# Patient Record
Sex: Female | Born: 1955 | Race: White | Hispanic: No | Marital: Married
Health system: Southern US, Community
[De-identification: ages and names within clinical notes are randomized; demographics above are authoritative.]

## PROBLEM LIST (undated history)

## (undated) DIAGNOSIS — M1990 Unspecified osteoarthritis, unspecified site: Secondary | ICD-10-CM

## (undated) DIAGNOSIS — M797 Fibromyalgia: Secondary | ICD-10-CM

## (undated) DIAGNOSIS — C801 Malignant (primary) neoplasm, unspecified: Secondary | ICD-10-CM

## (undated) DIAGNOSIS — G629 Polyneuropathy, unspecified: Secondary | ICD-10-CM

## (undated) HISTORY — DX: Malignant (primary) neoplasm, unspecified: C80.1

## (undated) HISTORY — DX: Fibromyalgia: M79.7

## (undated) HISTORY — PX: MANDIBLE SURGERY: SHX707

## (undated) HISTORY — PX: TONSILLECTOMY: SUR1361

## (undated) HISTORY — DX: Polyneuropathy, unspecified: G62.9

## (undated) HISTORY — DX: Unspecified osteoarthritis, unspecified site: M19.90

---

## 2005-09-23 HISTORY — PX: CHOLECYSTECTOMY: SHX55

## 2009-09-23 DIAGNOSIS — M797 Fibromyalgia: Secondary | ICD-10-CM

## 2009-09-23 HISTORY — DX: Fibromyalgia: M79.7

## 2010-04-09 ENCOUNTER — Ambulatory Visit: Payer: Self-pay | Admitting: Internal Medicine

## 2010-08-30 ENCOUNTER — Emergency Department (HOSPITAL_COMMUNITY): Admission: EM | Admit: 2010-08-30 | Discharge: 2009-12-20 | Payer: Self-pay | Admitting: Emergency Medicine

## 2011-06-23 IMAGING — CR DG ANKLE COMPLETE 3+V*L*
1 series · 4 of 4 positions shown · non-contrast
Comparison: none

REASON FOR EXAM: ARTHRITIS PAIN IN BILATERAL HANDS, KNEES, ANKLES AND FEET
COMMENTS:

[Series 1: view not recorded · 0.17mm/px · 4 of 4 slices shown]
[im 1/4]
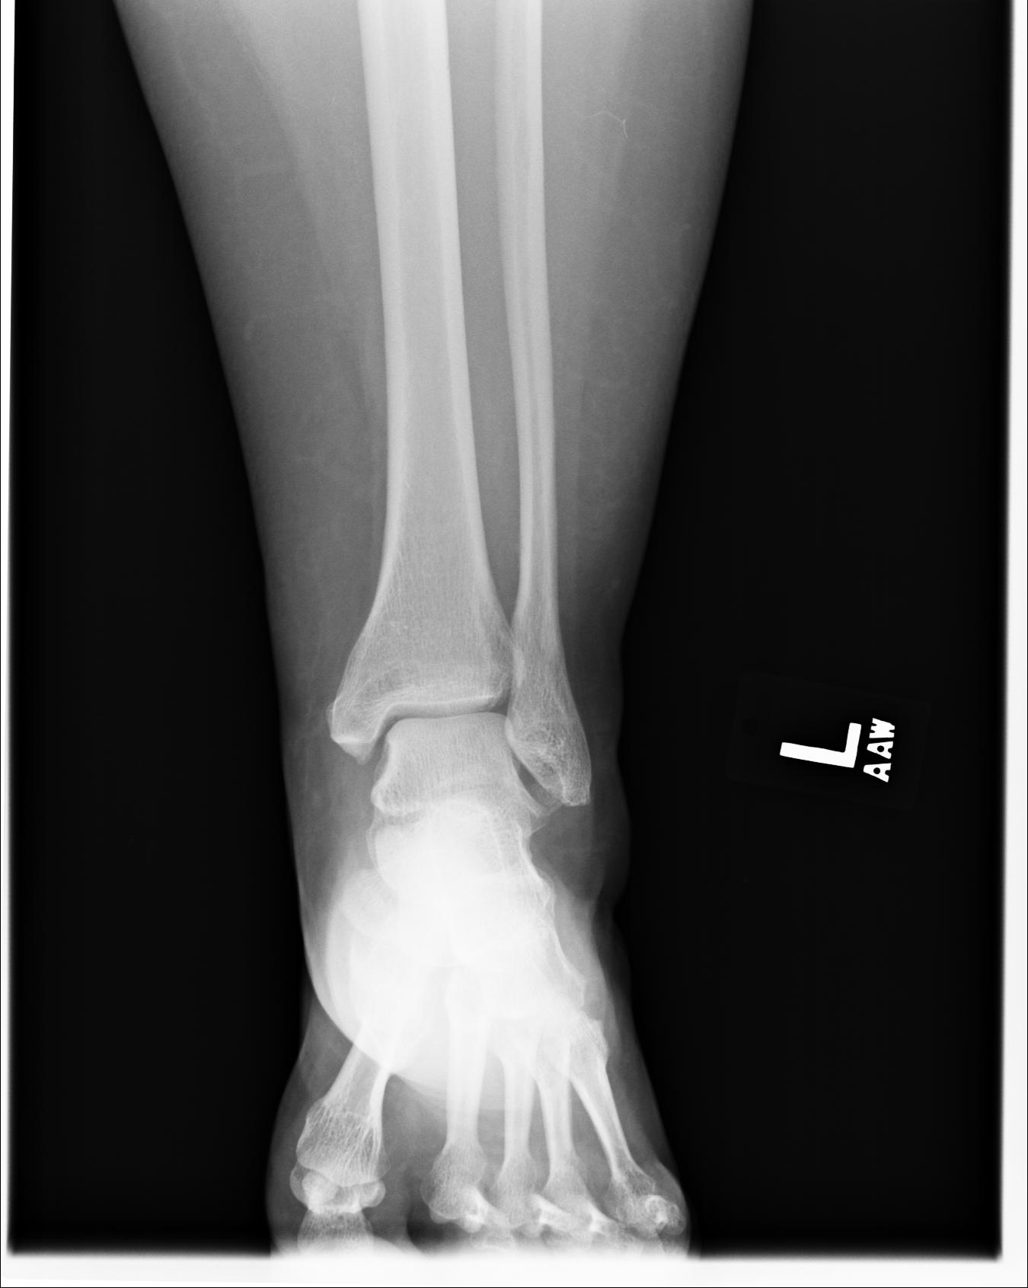
[im 2/4]
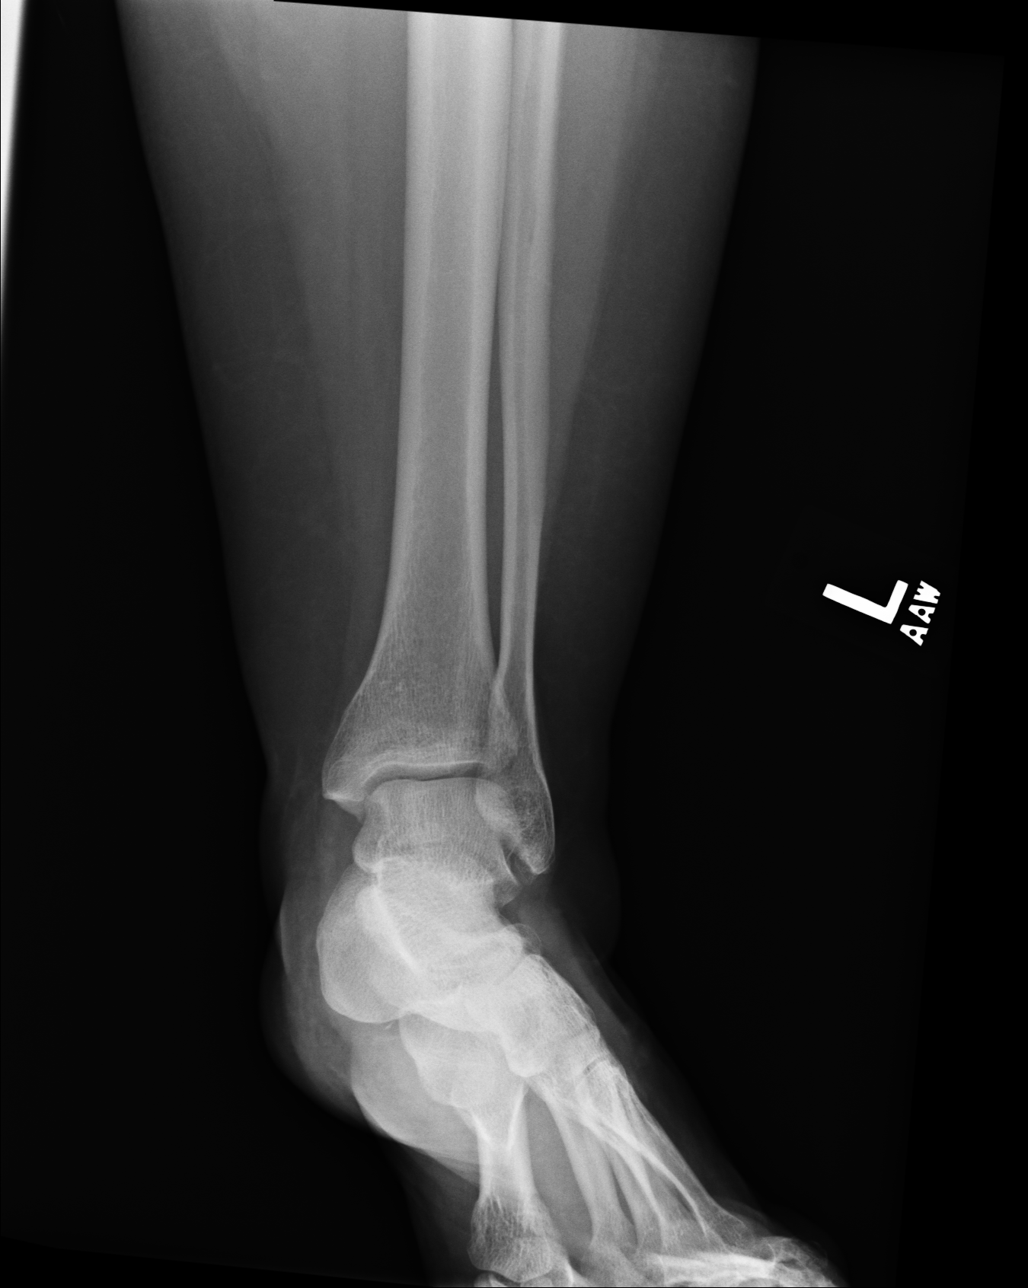
[im 3/4]
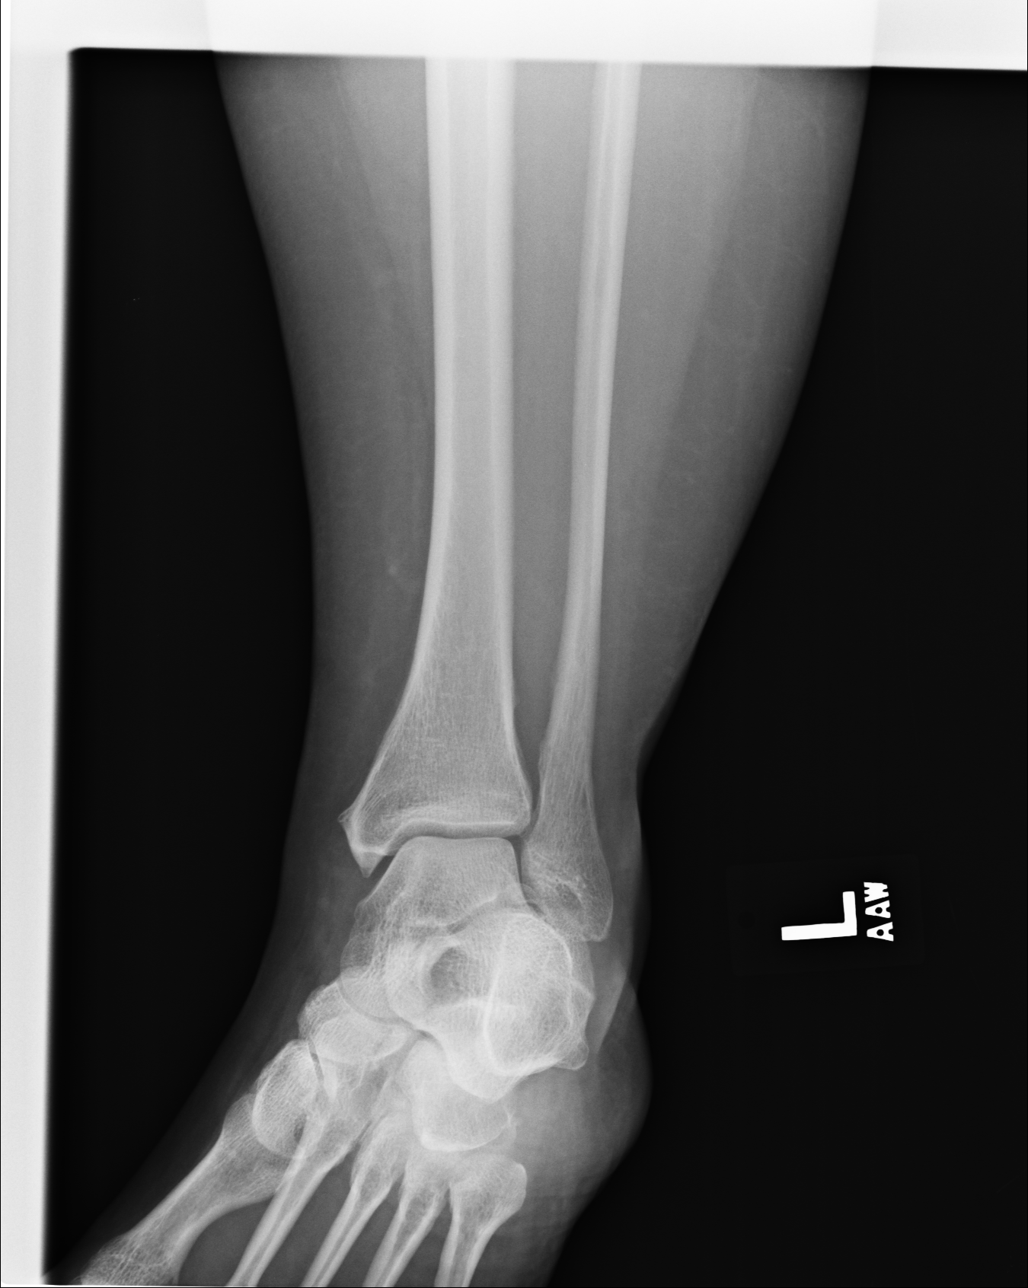
[im 4/4]
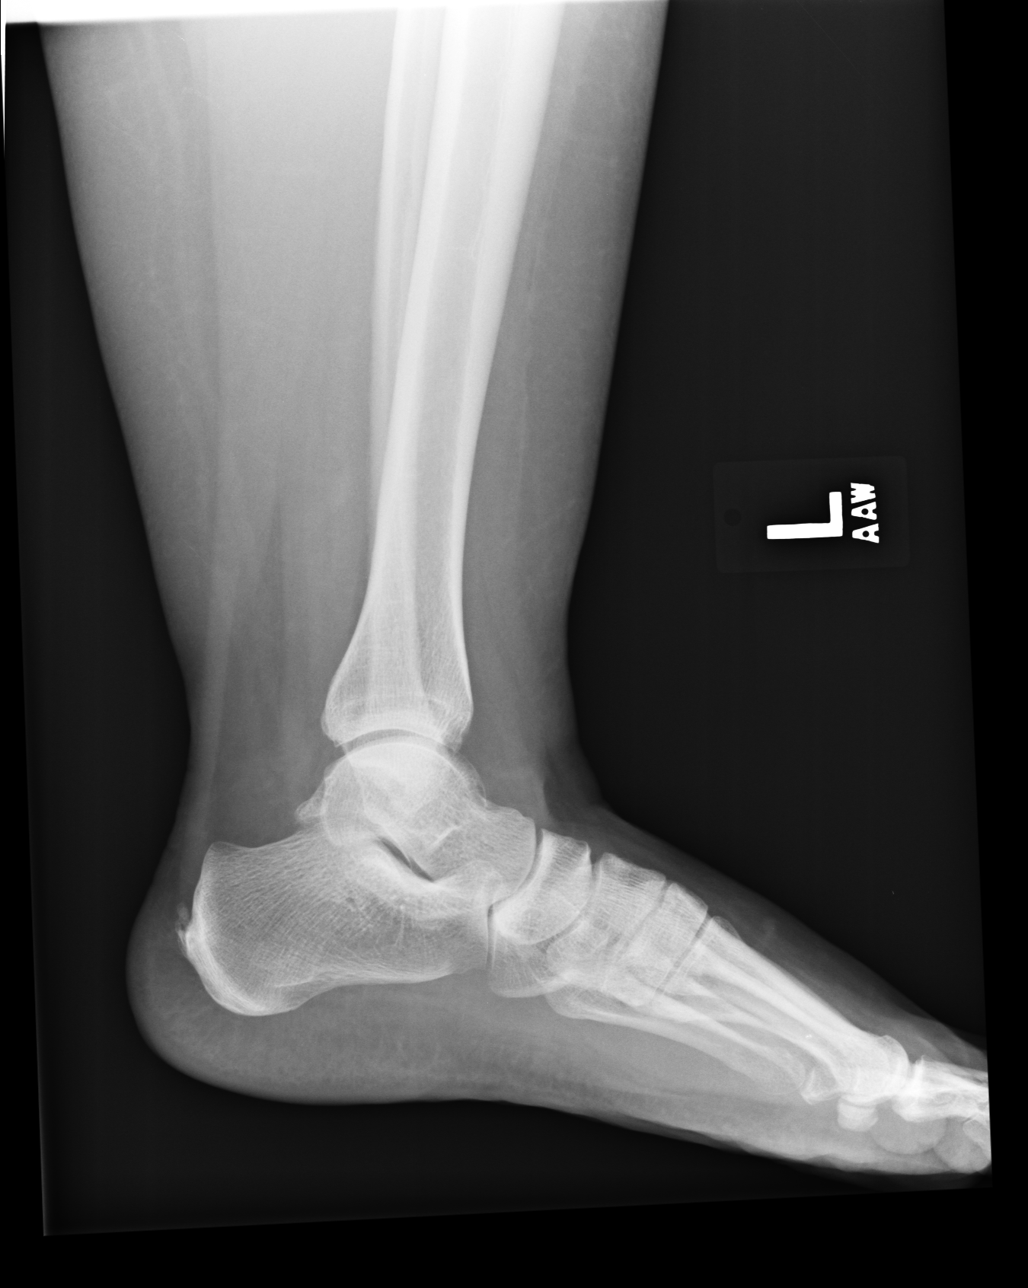

[4 of 4 positions shown; findings below may reference images not displayed]

PROCEDURE:     DXR - DXR ANKLE LEFT COMPLETE  - April 09, 2010  [DATE]

RESULT:     Four views of the left ankle are submitted. The ankle joint
mortise is preserved. The talar dome appears intact. I see no osteochondral
lesions. I do not see evidence of an acute malleolar fracture or other acute
abnormality of the malleolar height. There is an Achilles region calcaneal
spur.
IMPRESSION: I see no significant arthritic change of the left ankle.

## 2013-09-23 HISTORY — PX: KNEE ARTHROSCOPY WITH LATERAL MENISECTOMY: SHX6193

## 2013-12-16 ENCOUNTER — Ambulatory Visit: Payer: Self-pay | Admitting: Family Medicine

## 2014-03-07 ENCOUNTER — Ambulatory Visit: Payer: Self-pay | Admitting: Family Medicine

## 2014-09-02 ENCOUNTER — Ambulatory Visit: Payer: Self-pay | Admitting: Orthopedic Surgery

## 2014-09-29 ENCOUNTER — Ambulatory Visit: Payer: Self-pay | Admitting: Anesthesiology

## 2014-09-29 LAB — CBC WITH DIFFERENTIAL/PLATELET
BASOS ABS: 0 10*3/uL (ref 0.0–0.1)
Basophil %: 0.6 %
EOS PCT: 1.7 %
Eosinophil #: 0.1 10*3/uL (ref 0.0–0.7)
HCT: 40.4 % (ref 35.0–47.0)
HGB: 13.1 g/dL (ref 12.0–16.0)
Lymphocyte #: 1.6 10*3/uL (ref 1.0–3.6)
Lymphocyte %: 24 %
MCH: 29.9 pg (ref 26.0–34.0)
MCHC: 32.4 g/dL (ref 32.0–36.0)
MCV: 92 fL (ref 80–100)
Monocyte #: 0.6 x10 3/mm (ref 0.2–0.9)
Monocyte %: 8.3 %
NEUTROS PCT: 65.4 %
Neutrophil #: 4.4 10*3/uL (ref 1.4–6.5)
Platelet: 232 10*3/uL (ref 150–440)
RBC: 4.37 10*6/uL (ref 3.80–5.20)
RDW: 14.2 % (ref 11.5–14.5)
WBC: 6.7 10*3/uL (ref 3.6–11.0)

## 2014-09-29 LAB — BASIC METABOLIC PANEL
Anion Gap: 6 — ABNORMAL LOW (ref 7–16)
BUN: 11 mg/dL (ref 7–18)
CALCIUM: 9.3 mg/dL (ref 8.5–10.1)
Chloride: 106 mmol/L (ref 98–107)
Co2: 31 mmol/L (ref 21–32)
Creatinine: 1.02 mg/dL (ref 0.60–1.30)
EGFR (African American): >60
EGFR (Non-African Amer.): 59 — ABNORMAL LOW
Glucose: 88 mg/dL (ref 65–99)
Osmolality: 284 (ref 275–301)
POTASSIUM: 3.8 mmol/L (ref 3.5–5.1)
Sodium: 143 mmol/L (ref 136–145)

## 2014-10-04 ENCOUNTER — Ambulatory Visit: Payer: Self-pay | Admitting: Orthopedic Surgery

## 2014-12-30 ENCOUNTER — Ambulatory Visit
Admit: 2014-12-30 | Disposition: A | Discharge status: Home / Self Care | Payer: Self-pay | Attending: Family Medicine | Admitting: Family Medicine

## 2015-01-22 NOTE — Op Note (Signed)
PATIENT NAME:  Frances Krueger, Frances Krueger MR#:  [id number] DATE OF BIRTH:  Sep 22, 1956  DATE OF PROCEDURE:  10/04/2014  PREOPERATIVE DIAGNOSIS: Medial meniscus tear, left knee osteoarthritis.   POSTOPERATIVE DIAGNOSIS: Medial meniscus tear, left knee osteoarthritis with plica band.   PROCEDURE: Arthroscopy, left knee partial medial meniscectomy, excision of plica band.   ANESTHESIA: General.   SURGEON: Hessie Knows, MD   DESCRIPTION OF PROCEDURE: The patient was brought to the operating room and after adequate anesthesia was obtained, the left leg was prepped and draped in the usual sterile fashion with the leg in the arthroscopic leg holder with a tourniquet applied. After patient identification and timeout procedures were completed, an inferolateral portal was made and the arthroscope was introduced. Initial inspection revealed a very large plica band extending across the anterior aspect of the knee medially, appearing to impinge on both the patellofemoral joint and the medial condyle with flexion. There was moderate degenerative change to the patella with some fissuring, but no full-thickness cartilage loss. The patella appeared to track normally. Next, going around inferior medially, an inferomedial portal was made, and, on probing, there was a radial tear as previously shown on MRI at the junction of the middle and posterior thirds and fissuring of the cartilage adjacent to this area. The articular cartilage had partial thickness loss throughout the medial compartment. The ACL was intact and the lateral compartment was relatively normal with just a few small areas of grade 1 and 2 changes, otherwise relatively normal. The meniscus was intact to probing. The gutters were free of any loose bodies. A meniscal punch was used first, followed by a shaver and then an ArthroCare wand to resect the meniscus tear back to a stable margin with pre- and postprocedure pictures obtained. The tourniquet was raised when the  shaver was used for the plica to minimize bleeding and improve visualization. The plica was debrided with the shaver and ArthroCare wand used for cauterization of bleeding of small bleeding vessels. After complete resection of this very large band, again with pre- and postprocedure pictures obtained, the knee was thoroughly irrigated until clear. All instrumentation was withdrawn. The wounds were closed with simple interrupted 4-0 nylon and 20 mL of 0.5% Sensorcaine with epinephrine was infiltrated in the area of the portals for postoperative analgesia. Xeroform, 4 x 4's, Webril and Ace wrap applied. The patient was sent to the recovery room in stable condition.   ESTIMATED BLOOD LOSS: Minimal.   COMPLICATIONS: None.   SPECIMEN: None.   TOURNIQUET TIME: 13 minutes at 300 mmHg.    ____________________________ Laurene Footman, MD mjm:bm D: 10/04/2014 20:37:56 ET T: 10/05/2014 03:27:45 ET JOB#: [id number]  cc: Laurene Footman, MD, <Dictator> Laurene Footman MD ELECTRONICALLY SIGNED 10/05/2014 8:40

## 2015-09-24 DIAGNOSIS — G629 Polyneuropathy, unspecified: Secondary | ICD-10-CM

## 2015-09-24 HISTORY — DX: Polyneuropathy, unspecified: G62.9

## 2016-05-01 ENCOUNTER — Encounter: Payer: Self-pay | Admitting: Physical Therapy

## 2016-05-01 ENCOUNTER — Ambulatory Visit: Payer: BLUE CROSS/BLUE SHIELD | Attending: Internal Medicine | Admitting: Physical Therapy

## 2016-05-01 DIAGNOSIS — M791 Myalgia, unspecified site: Secondary | ICD-10-CM

## 2016-05-01 DIAGNOSIS — R2689 Other abnormalities of gait and mobility: Secondary | ICD-10-CM | POA: Diagnosis present

## 2016-05-02 NOTE — Therapy (Signed)
[place] MAIN [facility] SERVICES [address] [place], [place], [address] Phone: [phone]   Fax:  [phone]  Physical Therapy Evaluation  Patient Details  Name: Frances Krueger MRN: [id number] Date of Birth: 09-02-1956 Referring Provider: Meda Coffee  Encounter Date: 05/01/2016      PT End of Session - 05/02/16 2252    Visit Number 1   Number of Visits 12   Date for PT Re-Evaluation 07/09/16   PT Start Time 1500   PT Stop Time [id number]   PT Time Calculation (min) 75 min   Activity Tolerance Patient tolerated treatment well;No increased pain   Behavior During Therapy WFL for tasks assessed/performed      Past Medical History:  Diagnosis Date  . Arthritis    L knee  . Cancer ([place])    skin cancer   . Fibromyalgia 2011  . Fibromyalgia 2011  . Neuropathy ([place]) 2017   both feet     Past Surgical History:  Procedure Laterality Date  . CHOLECYSTECTOMY  2007  . KNEE ARTHROSCOPY WITH LATERAL MENISECTOMY  2015  . MANDIBLE SURGERY     at 60 years of age, surgery was done 2/2 under bite  . TONSILLECTOMY      There were no vitals filed for this visit.       Subjective Assessment - 05/02/16 2245    Subjective Pt reported BLE in legs and feet, shoulders/ necks, fingers. 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 Pain is worst in morning and night. 3-4 hrs of sleep 2/2 pain. Pt has tried aquatic therapy and she regained ROM but she felt it didnot help with the pain.  Today, pt is 5-6/10 in her knees, feet, ankles, fingers, shoulders. Husband and grandson present in the room during interview but left the room during assessment and Tx.      Pertinent History Dx of fibromyalgia, neuropathy    Patient Stated Goals work more on her job, do more housework, sleep more            [facility] PT Assessment - 05/02/16 2246      Assessment   Medical Diagnosis fibromyalgia   Referring Provider Meda Coffee     Precautions   Precautions None     Restrictions   Weight Bearing Restrictions No     Balance Screen   Has the patient fallen in the past 6 months No     Observation/Other Assessments   Observations hyperextended knees    Other Surveys  --  PDI 78%, ZUNG anxiety 55%, PSQI 67%      Sensation   Light Touch --  less sensation on L LE at L4, L5 > R      Coordination   Gross Motor Movements are Fluid and Coordinated --  chest breathing, diaphragm breathing achieved with minor cue     Posture/Postural Control   Posture Comments lumbopelvic perturbations with leg movements in supine     AROM   Overall AROM Comments L/R sidebend with LBP p!, L rotation ~20% , R rotation 40%      Strength   Overall Strength Comments 4/5 BLE gross assessment     Palpation   Palpation comment increased mm tensions and tenderness (finching) at upper traps to T 3 level of spine      Ambulation/Gait  Gait velocity 0.67 m/s                  Pelvic Floor Special Questions - 05/01/16 1557    Diastasis Recti 3 fingers below sternum, above umbilicus           [facility] Adult PT Treatment/Exercise - 05/02/16 2246      Therapeutic Activites    Therapeutic Activities --  educated on nn system, incorporating self care practices     Neuro Re-ed    Neuro Re-ed Details  guided diaphragmatic breathing, standing mechanics, semi tandems tance to offload on legs and 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, proper lifting mechanics, sweeping mechanics      Manual Therapy   Manual therapy comments light manual therapy at cranuim, ribcage, sacrum, thoracic segments in supine                PT Education - 05/02/16 2251    Education provided Yes   Education Details POC, anatomy/ physiology, pain science education, Customer service manager) Educated Patient   Methods Explanation;Demonstration;Tactile cues;Verbal  cues;Handout   Comprehension Returned demonstration;Verbalized understanding             PT Long Term Goals - 05/02/16 2302      PT LONG TERM GOAL #1   Title Pt will decrease her [place] score from  78% to < 50%  in order to participate in more family and community activities.      PT LONG TERM GOAL #2   Title Pt will decrease her score on PSQI 67% to < 50% in order to improve sleep quality and decrease inflammatory processes.    Time 12   Period Weeks   Status New     PT LONG TERM GOAL #3   Title Pt will decrease her ZUNG anxiety score from 55% to < 45% in order to demo IND with relaxation techniques for improved stress management, retrain pain, and to decrease mm tensions.    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  These pains are associated with her Dx of fibromyalgia, neuropathy, and arthritis. Pt 's clinical presentations include diastasis recti and poor deep core strength, decreased spinal mobility, increased mm tensions along upper back and back, poor posture, decreased balance, poor body  mechanics with loaded activities,decreased LE sensation along L4/5 dermatomes, and slow gait speed. Pt responded well to light manual technique after which she reported feeling 80% better with a more "relaxed" feeling. Pt also demo'd proper body mechanics with household activities, work stretches, and alternative standing postures to decrease mm strain/ tensions when standing at work. Pain returned with upright position and pt was  educated on POC which will help to strengthen deep core mm for postural stability and decreased overuse of superficial mm. Pt was also educated on pain science  which pt voiced understanding.   Patient will benefit from skilled therapeutic intervention in order to improve the following deficits and impairments:  Pain, Improper body mechanics, Impaired sensation, Abnormal gait, Impaired tone, Postural dysfunction, Other (comment), Increased muscle spasms, Hypermobility, Decreased scar mobility, Decreased mobility, Decreased coordination, Decreased activity tolerance, Decreased endurance, Hypomobility, Impaired perceived functional ability, Decreased balance, Decreased knowledge of precautions, Decreased safety awareness, Difficulty walking, Decreased strength, Decreased range of motion, Impaired flexibility         Rehab Potential Good   Clinical Impairments Affecting Rehab Potential chronicity of Sx, co-morbidities,    PT Frequency 1x / week   PT Duration 12 weeks   PT Treatment/Interventions Aquatic Therapy;ADLs/Self Care Home Management;Electrical Stimulation;Cryotherapy;Moist Heat;Traction;Balance training;Therapeutic exercise;Therapeutic activities;Functional mobility training;Stair training;Gait training;Neuromuscular re-education;Patient/family education;Prosthetic Training;Scar mobilization;Manual lymph drainage;Manual techniques;Energy conservation, pain science education   Consulted and Agree with Plan of Care Patient  husband and son present for interview. absent for assessment and  Tx      Patient will benefit from skilled therapeutic intervention in order to improve the following deficits and impairments:  Pain, Improper body mechanics, Impaired sensation, Abnormal gait, Impaired tone, Postural dysfunction, Other (comment), Increased muscle spasms, Hypermobility, Decreased scar mobility, Decreased mobility, Decreased coordination, Decreased activity tolerance, Decreased endurance,  Hypomobility, Impaired perceived functional ability, Decreased balance, Decreased knowledge of precautions, Decreased safety awareness, Difficulty walking, Decreased strength, Decreased range of motion, Impaired flexibility  Visit Diagnosis: Myalgia  Other abnormalities of gait and mobility     Problem List There are no active problems to display for this patient.   Jerl Mina ,PT, DPT, E-RYT 05/02/2016, 11:12 PM  [place] MAIN [facility] SERVICES [address] [place], [place], [address] Phone: [phone]   Fax:  [phone]  Name: Frances Krueger MRN: [id number] Date of Birth: 1956/04/02

## 2016-05-02 NOTE — Patient Instructions (Addendum)
Handout on body mechanics with household chores  diaphragmatic breathing and benefits for relaxation and decreasing mm tensions as a post work practice (10 min of rest)   Standing postures and semi tandem stance to offload spine hen standing at work Hip circles, heel raises, arm swings, shoulder rolls (backward) when on work breaks

## 2016-05-08 ENCOUNTER — Ambulatory Visit: Payer: BLUE CROSS/BLUE SHIELD | Admitting: Physical Therapy

## 2016-05-08 DIAGNOSIS — M791 Myalgia, unspecified site: Secondary | ICD-10-CM

## 2016-05-08 DIAGNOSIS — R2689 Other abnormalities of gait and mobility: Secondary | ICD-10-CM

## 2016-05-08 NOTE — Patient Instructions (Addendum)
Work Engineer, building services the spine in all 6 directions Shoulder roll backwards Heel raises    Stretching the R heel

## 2016-05-09 NOTE — Therapy (Addendum)
[place] MAIN [facility] SERVICES [address] [place], [place], [address] Phone: [phone]   Fax:  [phone]  Physical Therapy Treatment  Patient Details  Name: Frances Krueger MRN: [id number] Date of Birth: 03/15/1956 Referring Provider: Meda Coffee  Encounter Date: 05/08/2016      PT End of Session - 05/09/16 2324    Visit Number 2   Number of Visits 12   Date for PT Re-Evaluation 07/09/16   PT Start Time [id number]   PT Stop Time 1710   PT Time Calculation (min) 60 min   Activity Tolerance Patient tolerated treatment well;No increased pain   Behavior During Therapy WFL for tasks assessed/performed      Past Medical History:  Diagnosis Date  . Arthritis    L knee  . Cancer ([place])    skin cancer   . Fibromyalgia 2011  . Fibromyalgia 2011  . Neuropathy ([place]) 2017   both feet     Past Surgical History:  Procedure Laterality Date  . CHOLECYSTECTOMY  2007  . KNEE ARTHROSCOPY WITH LATERAL MENISECTOMY  2015  . MANDIBLE SURGERY     at 60 years of age, surgery was done 2/2 under bite  . TONSILLECTOMY      There were no vitals filed for this visit.      Subjective Assessment - 05/08/16 1615    Subjective Pt reported increased neck pain after last session and she took Ibuprofen which alleviated it. Pt states the sweeping, lifting, out of chair techniques helped her back and knee pain alot.  Pt said she has less radiating pain with the body mechanics.  Yesterday pt worked 5 hrs and she told her employers her "feet were killing her" and took Ibuprofen which alleviate the feet pain.              [facility] PT Assessment - 05/09/16 2318      Circumferential Edema   Circumferential - Right 10 cm below lateral tib plateau: 43 cm, 10 cm distally 38 cm, 25 cm above malleoli    Circumferential - Left  10 cm below lateral tib plateau: 44 cm, 10 cm distally 38 cm, 25.5 cm above malleoli      Figure 8 Edema   Figure 8 - Right  54 cm  ankle   Figure 8 - Left  55 cm  ankle     Sensation   Light Touch --  post Tx: sensation more symmetrical L4 Bilaterally     AROM   Overall AROM Comments R DF limited pre Tx, increased post Tx.      Palpation   Palpation comment non soft abdomen pre Tx, softer post Tx  increased fascial tensions along R peroneal retinacula to inferior externsor retinaculum                     [facility] Adult PT Treatment/Exercise - 05/09/16 2323      Therapeutic Activites    Therapeutic Activities --  see pt instructions     Manual Therapy   Manual therapy comments R ankle fascial releases, manual lymph massage abdomen, groin, neck, B legs                 PT Education - 05/09/16 2323    Education provided Yes   Education Details HEP, explained the role of lymph system and edema and her Sx   Person(s) Educated Patient   Methods Explanation;Demonstration;Tactile cues;Verbal cues;Handout   Comprehension Returned demonstration;Verbalized understanding  PT Long Term Goals - 05/09/16 2332      PT LONG TERM GOAL #1   Title Pt will decrease her [place] score from  78% to < 50%  in order to participate in more family and community activities.    Time 12   Period Weeks   Status On-going     PT LONG TERM GOAL #2   Title Pt will decrease her score on PSQI 67% to < 50% in order to improve sleep quality and decrease inflammatory processes.    Time 12   Period Weeks   Status On-going     PT LONG TERM GOAL #3   Title Pt will decrease her ZUNG anxiety score from 55% to < 45% in order to demo IND with relaxation techniques for improved stress management, retrain pain, and to decrease mm tensions.    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 .    Time 12   Period Weeks   Status New     Additional Long Term Goals   Additional Long Term Goals Yes               Plan - 05/08/16 1708    Clinical Impression Statement Since her last treatment session, pt showed improved spinal ROM and improved body mechanics with lifting. Follwing today's Tx,  pt reported her leg / feet pain dropped from a 4/10 to 0 /10 and her R heel pain decreased by 70%.  Pt also reported less pain with walking and demo'd improved gait.. Plan to continue to apply manual lymph massage to decrease edema as it showed to help her leg pain, in addition to helping her to regain more symmtrical light touch  sensation in L LE at L4 dermatome.  Added to HEP work movement exercises for improved leg circulation . Pt will return in two days because pt was not able to be scheduled the following week into PT's schedule.     Rehab Potential Good   Clinical Impairments Affecting Rehab Potential chronicity of Sx, co-morbidities,    PT Frequency 1x / week   PT Duration 12 weeks   PT Treatment/Interventions Aquatic Therapy;ADLs/Self Care Home Management;Electrical Stimulation;Cryotherapy;Moist Heat;Traction;Balance training;Therapeutic exercise;Therapeutic activities;Functional mobility training;Stair training;Gait training;Neuromuscular re-education;Patient/family education;Prosthetic Training;Scar mobilization;Manual lymph drainage;Manual techniques;Energy conservation   Consulted and Agree with Plan of Care Patient  husband and son present for interview. absent for assessment and  Tx      Patient will benefit from skilled therapeutic intervention in order to improve the following deficits and impairments:  Pain, Improper body mechanics, Impaired  sensation, Abnormal gait, Impaired tone, Postural dysfunction, Other (comment), Increased muscle spasms, Hypermobility, Decreased scar mobility, Decreased mobility, Decreased coordination, Decreased activity tolerance, Decreased endurance, Hypomobility, Impaired perceived functional ability, Decreased balance, Decreased knowledge of precautions, Decreased safety awareness, Difficulty walking, Decreased strength, Decreased range of motion, Impaired  flexibility  Visit Diagnosis: Myalgia  Other abnormalities of gait and mobility     Problem List There are no active problems to display for this patient.   Jerl Mina ,PT, DPT, E-RYT  05/09/2016, 11:35 PM  [place] MAIN [facility] SERVICES [address] [place], [place], [address] Phone: [phone]   Fax:  [phone]  Name: Frances Krueger MRN: [id number] Date of Birth: 1955-10-22

## 2016-05-10 ENCOUNTER — Ambulatory Visit: Payer: BLUE CROSS/BLUE SHIELD | Admitting: Physical Therapy

## 2016-05-10 DIAGNOSIS — M791 Myalgia, unspecified site: Secondary | ICD-10-CM

## 2016-05-10 DIAGNOSIS — R2689 Other abnormalities of gait and mobility: Secondary | ICD-10-CM

## 2016-05-10 NOTE — Therapy (Signed)
[place] MAIN [facility] SERVICES [address] [place], [place], [address] Phone: [phone]   Fax:  [phone]  Physical Therapy Treatment  Patient Details  Name: Frances Krueger MRN: [id number] Date of Birth: 1956-01-02 Referring Provider: Meda Coffee  Encounter Date: 05/10/2016      PT End of Session - 05/10/16 2342    Visit Number 3   Number of Visits 12   Date for PT Re-Evaluation 07/09/16   PT Start Time 1108   PT Stop Time [id number]   PT Time Calculation (min) 67 min   Activity Tolerance Patient tolerated treatment well;No increased pain   Behavior During Therapy WFL for tasks assessed/performed      Past Medical History:  Diagnosis Date  . Arthritis    L knee  . Cancer ([place])    skin cancer   . Fibromyalgia 2011  . Fibromyalgia 2011  . Neuropathy ([place]) 2017   both feet     Past Surgical History:  Procedure Laterality Date  . CHOLECYSTECTOMY  2007  . KNEE ARTHROSCOPY WITH LATERAL MENISECTOMY  2015  . MANDIBLE SURGERY     at 60 years of age, surgery was done 2/2 under bite  . TONSILLECTOMY      There were no vitals filed for this visit.      Subjective Assessment - 05/10/16 1117    Subjective Pt reported she had no pain in her B feet when she left at last session but it returned at a level of 2-3/10 later night.  Pt reported a terrible headache located at the base of her skull after last session but it was not as bad as the headache that occurred after the first session.  Pt states the Ibuprofen resolved it completely.  Pt states the headache feels like "drawing and a stiffness" in that area.  Pt worked yesterday for 4 hrs and she went into work with feet pain 6/10 and left work at a 8-9/10.    Pertinent History Dx of fibromyalgia, neuropathy    Patient Stated Goals work more on her job, do more housework, sleep more            [facility] PT Assessment - 05/10/16 2339      Figure 8 Edema   Figure 8 - Right  --  no change    Figure 8 - Left  --  no change     Palpation   Palpation comment hypomobility along cervical spine, increased upper trap mm tensions, tenderness with PAIVM                     [facility] Adult PT Treatment/Exercise - 05/10/16 2340      Therapeutic Activites    Therapeutic Activities --  educated, demo'd self lymph massage, foot massage     Manual Therapy   Manual therapy comments R ankle fascial releases, manual lymph massage abdomen, groin, neck, B legs   light manual over occiput (distraction), sacrum                PT Education - 05/10/16 1259    Education provided Yes   Education Details HEP   Person(s) Educated Patient   Methods Explanation;Demonstration;Tactile cues;Verbal cues;Handout   Comprehension Returned demonstration;Verbalized understanding             PT Long Term Goals - 05/09/16 2332      PT LONG TERM GOAL #1   Title Pt will decrease her [place] score from  78% to < 50%  in order to participate in more family and community activities.    Time 12   Period Weeks   Status On-going     PT LONG TERM GOAL #2   Title Pt will decrease her score on PSQI 67% to < 50% in order to improve sleep quality and decrease inflammatory processes.    Time 12   Period Weeks   Status On-going     PT LONG TERM GOAL #3   Title Pt will decrease her ZUNG anxiety score from 55% to < 45% in order to demo IND with relaxation techniques for improved stress management, retrain pain, and to decrease mm tensions.    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 .    Time 12   Period Weeks   Status New     Additional Long Term Goals   Additional Long Term Goals Yes               Plan - 05/10/16 2342    Clinical Impression Statement Pt reported decreased neck tensions and no headache following Tx. Pt demo'd cervical retraction HEP properly and anticipate this will help her complaints of HA as she demo'd severe forward head.  Pt reported her legs feeling better with self-massage techniques which was added into her HEP. Pt had less antalgic gait post-Tx. Pt is progressing well.    Rehab Potential Good   Clinical Impairments Affecting Rehab Potential chronicity of Sx, co-morbidities,    PT Frequency 1x / week   PT Duration 12 weeks   PT Treatment/Interventions Aquatic Therapy;ADLs/Self Care Home Management;Electrical Stimulation;Cryotherapy;Moist Heat;Traction;Balance training;Therapeutic exercise;Therapeutic activities;Functional mobility training;Stair training;Gait training;Neuromuscular re-education;Patient/family education;Prosthetic Training;Scar mobilization;Manual lymph drainage;Manual techniques;Energy conservation   Consulted and Agree with Plan of Care Patient  husband and son present for interview. absent for assessment and  Tx      Patient will benefit from skilled therapeutic intervention in order to improve the following deficits and impairments:  Pain, Improper body mechanics, Impaired sensation, Abnormal gait, Impaired tone, Postural dysfunction, Other (comment), Increased muscle spasms, Hypermobility, Decreased scar mobility, Decreased mobility, Decreased coordination, Decreased activity tolerance, Decreased endurance, Hypomobility, Impaired perceived functional ability, Decreased balance, Decreased knowledge of precautions, Decreased safety awareness, Difficulty walking, Decreased strength, Decreased range of motion, Impaired flexibility  Visit Diagnosis: Myalgia  Other  abnormalities of gait and mobility     Problem List There are no active problems to display for this patient.   Jerl Mina ,PT, DPT, E-RYT  05/10/2016, 11:45 PM  [place] MAIN [facility] SERVICES [address] [place], [place], [address] Phone: [phone]   Fax:  [phone]  Name: Frances Krueger MRN: [id number] Date of Birth: [birth year]

## 2016-05-10 NOTE — Patient Instructions (Signed)
Morning routine: On your back   diaphagmatic breathing 10 x Pelvic tilts 10 x  With breathing  Half circles upper/lower over abdomen 10x Armpits 10x Back of the neck Groin  Sitting: Butterfly sweeps along ankle to calf 5 reps   Foot massage: Thumbs sweep heels to ballmounds outward toe bending with hand shake with sole side between toes , supporting foot on the front  with other hand ankle bending ,  supporting foot with other hand   _______  During the day  Chin tuck 10 reps x 3-5 x day

## 2016-05-17 ENCOUNTER — Encounter: Payer: BLUE CROSS/BLUE SHIELD | Admitting: Physical Therapy

## 2016-05-22 ENCOUNTER — Ambulatory Visit: Payer: BLUE CROSS/BLUE SHIELD | Admitting: Physical Therapy

## 2016-05-22 DIAGNOSIS — M791 Myalgia, unspecified site: Secondary | ICD-10-CM

## 2016-05-22 DIAGNOSIS — R2689 Other abnormalities of gait and mobility: Secondary | ICD-10-CM

## 2016-05-22 NOTE — Therapy (Signed)
[place] MAIN [facility] SERVICES [address] [place], [place], [address] Phone: [phone]   Fax:  [phone]  Physical Therapy Treatment  Patient Details  Name: Frances Krueger MRN: [id number] Date of Birth: 08-21-1956 Referring Provider: Meda Coffee  Encounter Date: 05/22/2016      PT End of Session - 05/22/16 1652    Visit Number 3   Number of Visits 12   Date for PT Re-Evaluation 07/09/16   PT Start Time 1500   PT Stop Time 1610   PT Time Calculation (min) 70 min   Activity Tolerance Patient tolerated treatment well;No increased pain   Behavior During Therapy WFL for tasks assessed/performed      Past Medical History:  Diagnosis Date  . Arthritis    L knee  . Cancer ([place])    skin cancer   . Fibromyalgia 2011  . Fibromyalgia 2011  . Neuropathy ([place]) 2017   both feet     Past Surgical History:  Procedure Laterality Date  . CHOLECYSTECTOMY  2007  . KNEE ARTHROSCOPY WITH LATERAL MENISECTOMY  2015  . MANDIBLE SURGERY     at 60 years of age, surgery was done 2/2 under bite  . TONSILLECTOMY      There were no vitals filed for this visit.      Subjective Assessment - 05/22/16 1503    Subjective Pt reported she said the pain after the 2nd session resolved and she was able to walk the best she has ever walked on her feet, but the pain returned that night. After the 3rd visit, pt did not notice as much of a change. Pt continues to perform her HEP.  Today, pt feels pain in her feet .      Pertinent History Dx of fibromyalgia, neuropathy    Patient Stated Goals work more on her job, do more housework, sleep more            [facility] PT Assessment - 05/22/16 1707      Palpation   SI assessment  increased tenderness along transverse perineal diaphragm of pelvic floor mm, ischiocavernous, bulbospongiosus on R.  ischial tubercle (hamstring attachments), between adductor magnus  and longus on R    Palpation comment tenderness with  palpation along lateral/ posterior calcaneus R (decreased post Tx)      Ambulation/Gait   Gait Comments antalgic gait (no antalgic gait with cues for increased hip flex/ ext B )                    Pelvic Floor Special Questions - 05/22/16 1513    Diastasis Recti no separation           [facility] Adult PT Treatment/Exercise - 05/22/16 1707      Therapeutic Activites    Therapeutic Activities --  gait training for hip/ foot mobility, deep core level 2      Neuro Re-ed    Neuro Re-ed Details  cues for HEP     Manual Therapy   Manual therapy comments fascial releases along problem areas indicated in "pt assessments" and also long axis distraction of R leg in open packed position,                 PT Education - 05/22/16 1605    Education provided Yes   Education Details HEP   Person(s) Educated Patient   Methods Explanation;Demonstration;Tactile cues;Verbal cues;Handout   Comprehension Returned demonstration;Verbalized understanding  PT Long Term Goals - 05/22/16 1501      PT LONG TERM GOAL #1   Title Pt will decrease her [place] score from  78% to < 50%  in order to participate in more family and community activities.    Time 12   Period Weeks   Status On-going     PT LONG TERM GOAL #2   Title Pt will decrease her score on PSQI 67% to < 50% in order to improve sleep quality and decrease inflammatory processes.    Time 12   Period Weeks   Status On-going     PT LONG TERM GOAL #3   Title Pt will decrease her ZUNG anxiety score from 55% to < 45% in order to demo IND with relaxation techniques for improved stress management, retrain pain, and to decrease mm tensions.    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 .    Time 12   Period Weeks   Status On-going               Plan - 05/22/16 1617    Clinical Impression Statement Pt showed compliance with HEP and improved spinal mobility from previous Tx. Today, pt reported her pain in her R heel improved by 60% following her Tx that addressed the increased mm tensions at the R sided pelvic floor mm and medial thigh. Pt demo'd improved gait mechanics with cues for increased hip and feet mobility. Pt 's leg pain and edema did not change after session nor between sessions and therefore, PT will be referring pt to lymphedema specialist for a screen. Pt will also be undergoing a nerve conduction study next week which will be helpful to understand the degree of pt's neuropathy.  Initiated pt on deep core strengthening and coordination which will likely help with postural stability/ gait and possibly decreasing loading forces onto lower kinetic chain deficits from chronic orthopedic conditions (i.e. R plantar fascitiis and L knee meniscus surgery).  Pt will continue to benefit from skilled PT.  Plan to continue with gait training with considerations of her L and R LE issues at next session. Add pain science education and biopsychosocial strategies.       Rehab Potential Good   Clinical Impairments Affecting Rehab Potential chronicity of Sx, co-morbidities,    PT Frequency 1x / week   PT Duration 12 weeks   PT Treatment/Interventions Aquatic Therapy;ADLs/Self Care Home Management;Electrical Stimulation;Cryotherapy;Moist Heat;Traction;Balance training;Therapeutic exercise;Therapeutic activities;Functional mobility training;Stair training;Gait training;Neuromuscular re-education;Patient/family education;Prosthetic Training;Scar  mobilization;Manual lymph drainage;Manual techniques;Energy conservation   Consulted and Agree with Plan of Care Patient      Patient will benefit from skilled therapeutic intervention in order to improve the following deficits and impairments:  Pain, Improper body mechanics, Impaired sensation, Abnormal gait, Impaired tone, Postural dysfunction, Other (comment), Increased muscle spasms, Hypermobility, Decreased scar mobility, Decreased mobility,  Decreased coordination, Decreased activity tolerance, Decreased endurance, Hypomobility, Impaired perceived functional ability, Decreased balance, Decreased knowledge of precautions, Decreased safety awareness, Difficulty walking, Decreased strength, Decreased range of motion, Impaired flexibility  Visit Diagnosis: Myalgia  Other abnormalities of gait and mobility     Problem List There are no active problems to display for this patient.   Jerl Mina ,PT, DPT, E-RYT  05/22/2016, 5:22 PM  [place] MAIN [facility] SERVICES [address] , [place], [address] Phone: [phone]   Fax:  [phone]  Name: Frances Krueger MRN: [id number] Date of Birth: 21-Dec-1955

## 2016-05-22 NOTE — Patient Instructions (Signed)
You are now ready to begin training the deep core muscles system: diaphragm, transverse abdominis, pelvic floor . These muscles must work together as a team.           The key to these exercises to train the brain to coordinate the timing of these muscles and to have them turn on for long periods of time to hold you upright against gravity (especially important if you are on your feet all day).These muscles are postural muscles and play a role stabilizing your spine and bodyweight. By doing these repetitions slowly and correctly instead of doing crunches, you will achieve a flatter belly without a lower pooch. You are also placing your spine in a more neutral position and breathing properly which in turn, decreases your risk for problems related to your pelvic floor, abdominal, and low back such as pelvic organ prolapse, hernias, diastasis recti (separation of superficial muscles), disk herniations, spinal fractures. These exercises set a solid foundation for you to later progress to resistance/ strength training with therabands and weights and return to other typical fitness exercises with a stronger deeper core.  Level 2 30 rep x 2 day     And walking with higher thighs, pushing off back foot before lifting leg forward  (tapping hand on thighs) to increase range of motion in hips and to bear weight on feet equally in both legs

## 2016-06-06 ENCOUNTER — Ambulatory Visit: Payer: BLUE CROSS/BLUE SHIELD | Admitting: Physical Therapy

## 2016-06-20 ENCOUNTER — Encounter: Payer: BLUE CROSS/BLUE SHIELD | Admitting: Physical Therapy

## 2016-07-08 ENCOUNTER — Encounter: Payer: BLUE CROSS/BLUE SHIELD | Admitting: Physical Therapy

## 2016-07-24 ENCOUNTER — Encounter: Payer: BLUE CROSS/BLUE SHIELD | Admitting: Physical Therapy

## 2016-08-07 ENCOUNTER — Encounter: Payer: BLUE CROSS/BLUE SHIELD | Admitting: Physical Therapy

## 2016-08-21 ENCOUNTER — Encounter: Payer: BLUE CROSS/BLUE SHIELD | Admitting: Physical Therapy

## 2020-11-29 DIAGNOSIS — Z1231 Encounter for screening mammogram for malignant neoplasm of breast: Secondary | ICD-10-CM | POA: Diagnosis not present

## 2020-12-06 DIAGNOSIS — Z0000 Encounter for general adult medical examination without abnormal findings: Secondary | ICD-10-CM | POA: Diagnosis not present

## 2020-12-06 DIAGNOSIS — R739 Hyperglycemia, unspecified: Secondary | ICD-10-CM | POA: Diagnosis not present

## 2020-12-06 DIAGNOSIS — E559 Vitamin D deficiency, unspecified: Secondary | ICD-10-CM | POA: Diagnosis not present

## 2020-12-06 DIAGNOSIS — E538 Deficiency of other specified B group vitamins: Secondary | ICD-10-CM | POA: Diagnosis not present

## 2020-12-13 DIAGNOSIS — Z1211 Encounter for screening for malignant neoplasm of colon: Secondary | ICD-10-CM | POA: Diagnosis not present

## 2020-12-13 DIAGNOSIS — K219 Gastro-esophageal reflux disease without esophagitis: Secondary | ICD-10-CM | POA: Diagnosis not present

## 2020-12-13 DIAGNOSIS — M797 Fibromyalgia: Secondary | ICD-10-CM | POA: Diagnosis not present

## 2020-12-13 DIAGNOSIS — Z0000 Encounter for general adult medical examination without abnormal findings: Secondary | ICD-10-CM | POA: Diagnosis not present

## 2020-12-13 DIAGNOSIS — G608 Other hereditary and idiopathic neuropathies: Secondary | ICD-10-CM | POA: Diagnosis not present

## 2020-12-20 DIAGNOSIS — H6122 Impacted cerumen, left ear: Secondary | ICD-10-CM | POA: Diagnosis not present

## 2021-05-31 DIAGNOSIS — D122 Benign neoplasm of ascending colon: Secondary | ICD-10-CM | POA: Diagnosis not present

## 2021-05-31 DIAGNOSIS — D125 Benign neoplasm of sigmoid colon: Secondary | ICD-10-CM | POA: Diagnosis not present

## 2021-05-31 DIAGNOSIS — Z8371 Family history of colonic polyps: Secondary | ICD-10-CM | POA: Diagnosis not present

## 2021-05-31 DIAGNOSIS — D123 Benign neoplasm of transverse colon: Secondary | ICD-10-CM | POA: Diagnosis not present

## 2021-05-31 DIAGNOSIS — K6389 Other specified diseases of intestine: Secondary | ICD-10-CM | POA: Diagnosis not present

## 2021-05-31 DIAGNOSIS — K5730 Diverticulosis of large intestine without perforation or abscess without bleeding: Secondary | ICD-10-CM | POA: Diagnosis not present

## 2021-05-31 DIAGNOSIS — K635 Polyp of colon: Secondary | ICD-10-CM | POA: Diagnosis not present

## 2021-05-31 DIAGNOSIS — D126 Benign neoplasm of colon, unspecified: Secondary | ICD-10-CM | POA: Diagnosis not present

## 2021-05-31 DIAGNOSIS — Z1211 Encounter for screening for malignant neoplasm of colon: Secondary | ICD-10-CM | POA: Diagnosis not present

## 2021-08-01 DIAGNOSIS — N760 Acute vaginitis: Secondary | ICD-10-CM | POA: Diagnosis not present

## 2022-06-10 DIAGNOSIS — M25562 Pain in left knee: Secondary | ICD-10-CM | POA: Diagnosis not present

## 2022-06-10 DIAGNOSIS — M79605 Pain in left leg: Secondary | ICD-10-CM | POA: Diagnosis not present

## 2022-06-10 DIAGNOSIS — M25561 Pain in right knee: Secondary | ICD-10-CM | POA: Diagnosis not present

## 2022-06-10 DIAGNOSIS — M79604 Pain in right leg: Secondary | ICD-10-CM | POA: Diagnosis not present

## 2022-06-25 DIAGNOSIS — M1712 Unilateral primary osteoarthritis, left knee: Secondary | ICD-10-CM | POA: Diagnosis not present

## 2022-06-25 DIAGNOSIS — S83203A Other tear of unspecified meniscus, current injury, right knee, initial encounter: Secondary | ICD-10-CM | POA: Diagnosis not present

## 2022-07-19 DIAGNOSIS — M25461 Effusion, right knee: Secondary | ICD-10-CM | POA: Diagnosis not present

## 2022-07-19 DIAGNOSIS — M25561 Pain in right knee: Secondary | ICD-10-CM | POA: Diagnosis not present

## 2022-07-24 DIAGNOSIS — M25561 Pain in right knee: Secondary | ICD-10-CM | POA: Diagnosis not present

## 2022-07-24 DIAGNOSIS — M25461 Effusion, right knee: Secondary | ICD-10-CM | POA: Diagnosis not present

## 2022-07-26 DIAGNOSIS — M25561 Pain in right knee: Secondary | ICD-10-CM | POA: Diagnosis not present

## 2022-07-26 DIAGNOSIS — M25461 Effusion, right knee: Secondary | ICD-10-CM | POA: Diagnosis not present

## 2022-08-02 DIAGNOSIS — M25461 Effusion, right knee: Secondary | ICD-10-CM | POA: Diagnosis not present

## 2022-08-02 DIAGNOSIS — M25561 Pain in right knee: Secondary | ICD-10-CM | POA: Diagnosis not present

## 2022-08-07 DIAGNOSIS — M1712 Unilateral primary osteoarthritis, left knee: Secondary | ICD-10-CM | POA: Diagnosis not present

## 2022-08-07 DIAGNOSIS — S83241D Other tear of medial meniscus, current injury, right knee, subsequent encounter: Secondary | ICD-10-CM | POA: Diagnosis not present

## 2022-08-13 ENCOUNTER — Other Ambulatory Visit: Payer: Self-pay | Admitting: Orthopedic Surgery

## 2022-08-13 DIAGNOSIS — S83241S Other tear of medial meniscus, current injury, right knee, sequela: Secondary | ICD-10-CM

## 2022-08-21 ENCOUNTER — Ambulatory Visit
Admission: RE | Admit: 2022-08-21 | Discharge: 2022-08-21 | Disposition: A | Discharge status: Home / Self Care | Payer: Medicare HMO | Source: Ambulatory Visit | Attending: Orthopedic Surgery | Admitting: Orthopedic Surgery

## 2022-08-21 DIAGNOSIS — S83241A Other tear of medial meniscus, current injury, right knee, initial encounter: Secondary | ICD-10-CM | POA: Diagnosis not present

## 2022-08-21 DIAGNOSIS — M25461 Effusion, right knee: Secondary | ICD-10-CM | POA: Diagnosis not present

## 2022-08-21 DIAGNOSIS — S83241S Other tear of medial meniscus, current injury, right knee, sequela: Secondary | ICD-10-CM | POA: Diagnosis not present

## 2022-08-28 DIAGNOSIS — S83241D Other tear of medial meniscus, current injury, right knee, subsequent encounter: Secondary | ICD-10-CM | POA: Diagnosis not present

## 2022-08-28 DIAGNOSIS — M84469A Pathological fracture, unspecified tibia and fibula, initial encounter for fracture: Secondary | ICD-10-CM | POA: Diagnosis not present

## 2022-11-27 DIAGNOSIS — H5203 Hypermetropia, bilateral: Secondary | ICD-10-CM | POA: Diagnosis not present

## 2022-11-27 DIAGNOSIS — H5213 Myopia, bilateral: Secondary | ICD-10-CM | POA: Diagnosis not present

## 2022-12-11 DIAGNOSIS — Z0100 Encounter for examination of eyes and vision without abnormal findings: Secondary | ICD-10-CM | POA: Diagnosis not present

## 2023-04-11 DIAGNOSIS — N898 Other specified noninflammatory disorders of vagina: Secondary | ICD-10-CM | POA: Diagnosis not present

## 2023-04-23 DIAGNOSIS — R109 Unspecified abdominal pain: Secondary | ICD-10-CM | POA: Diagnosis not present

## 2023-04-23 DIAGNOSIS — N200 Calculus of kidney: Secondary | ICD-10-CM | POA: Diagnosis not present

## 2023-04-23 DIAGNOSIS — R1031 Right lower quadrant pain: Secondary | ICD-10-CM | POA: Diagnosis not present

## 2023-04-23 DIAGNOSIS — R8290 Unspecified abnormal findings in urine: Secondary | ICD-10-CM | POA: Diagnosis not present

## 2023-04-23 DIAGNOSIS — I878 Other specified disorders of veins: Secondary | ICD-10-CM | POA: Diagnosis not present

## 2023-10-16 DIAGNOSIS — R110 Nausea: Secondary | ICD-10-CM | POA: Diagnosis not present

## 2023-10-16 DIAGNOSIS — G4700 Insomnia, unspecified: Secondary | ICD-10-CM | POA: Diagnosis not present

## 2023-10-16 DIAGNOSIS — M797 Fibromyalgia: Secondary | ICD-10-CM | POA: Diagnosis not present

## 2023-10-16 DIAGNOSIS — G608 Other hereditary and idiopathic neuropathies: Secondary | ICD-10-CM | POA: Diagnosis not present

## 2023-10-16 DIAGNOSIS — Z0000 Encounter for general adult medical examination without abnormal findings: Secondary | ICD-10-CM | POA: Diagnosis not present

## 2023-11-20 DIAGNOSIS — Z1231 Encounter for screening mammogram for malignant neoplasm of breast: Secondary | ICD-10-CM | POA: Diagnosis not present

## 2024-02-13 DIAGNOSIS — R739 Hyperglycemia, unspecified: Secondary | ICD-10-CM | POA: Diagnosis not present

## 2024-02-13 DIAGNOSIS — E785 Hyperlipidemia, unspecified: Secondary | ICD-10-CM | POA: Diagnosis not present

## 2024-02-13 DIAGNOSIS — E538 Deficiency of other specified B group vitamins: Secondary | ICD-10-CM | POA: Diagnosis not present

## 2024-02-13 DIAGNOSIS — E559 Vitamin D deficiency, unspecified: Secondary | ICD-10-CM | POA: Diagnosis not present

## 2024-02-19 DIAGNOSIS — Z0000 Encounter for general adult medical examination without abnormal findings: Secondary | ICD-10-CM | POA: Diagnosis not present

## 2024-02-19 DIAGNOSIS — M797 Fibromyalgia: Secondary | ICD-10-CM | POA: Diagnosis not present

## 2024-02-19 DIAGNOSIS — R110 Nausea: Secondary | ICD-10-CM | POA: Diagnosis not present

## 2024-02-19 DIAGNOSIS — Z1331 Encounter for screening for depression: Secondary | ICD-10-CM | POA: Diagnosis not present

## 2024-02-19 DIAGNOSIS — Z1211 Encounter for screening for malignant neoplasm of colon: Secondary | ICD-10-CM | POA: Diagnosis not present

## 2024-04-06 DIAGNOSIS — R300 Dysuria: Secondary | ICD-10-CM | POA: Diagnosis not present

## 2024-10-07 ENCOUNTER — Ambulatory Visit: Payer: Self-pay

## 2024-10-07 DIAGNOSIS — Z09 Encounter for follow-up examination after completed treatment for conditions other than malignant neoplasm: Secondary | ICD-10-CM | POA: Diagnosis not present

## 2024-10-07 DIAGNOSIS — K2960 Other gastritis without bleeding: Secondary | ICD-10-CM | POA: Diagnosis not present

## 2024-10-07 DIAGNOSIS — Z860101 Personal history of adenomatous and serrated colon polyps: Secondary | ICD-10-CM | POA: Diagnosis not present

## 2024-10-07 DIAGNOSIS — K5730 Diverticulosis of large intestine without perforation or abscess without bleeding: Secondary | ICD-10-CM | POA: Diagnosis not present

## 2024-10-07 DIAGNOSIS — K640 First degree hemorrhoids: Secondary | ICD-10-CM | POA: Diagnosis not present

## 2024-10-07 DIAGNOSIS — K635 Polyp of colon: Secondary | ICD-10-CM | POA: Diagnosis not present
